# Patient Record
Sex: Female | Born: 1955 | Race: White | Hispanic: No | Marital: Married | Smoking: Never smoker
Health system: Southern US, Community
[De-identification: ages and names within clinical notes are randomized; demographics above are authoritative.]

## PROBLEM LIST (undated history)

## (undated) DIAGNOSIS — K121 Other forms of stomatitis: Secondary | ICD-10-CM

## (undated) DIAGNOSIS — I10 Essential (primary) hypertension: Secondary | ICD-10-CM

## (undated) DIAGNOSIS — E119 Type 2 diabetes mellitus without complications: Secondary | ICD-10-CM

## (undated) DIAGNOSIS — I739 Peripheral vascular disease, unspecified: Secondary | ICD-10-CM

## (undated) DIAGNOSIS — E7800 Pure hypercholesterolemia, unspecified: Secondary | ICD-10-CM

## (undated) HISTORY — PX: CHOLECYSTECTOMY: SHX55

## (undated) HISTORY — PX: TONSILLECTOMY: SUR1361

## (undated) HISTORY — PX: HERNIA REPAIR: SHX51

---

## 2015-09-26 NOTE — Progress Notes (Signed)
Subjective:    Patient ID: Tricia Schwartz, female    DOB: [birth year], 60 y.o.   MRN: [id number]  HPI pt states DM was dx'ed in 2000 (she had GDM in 1982 and 1994); she has moderate neuropathy of the lower extremities, and assoc pain; she is unaware of any associated chronic complications; she has been on insulin since 2007; pt says her diet and exercise are fair; she has never had GDM, pancreatitis, severe hypoglycemia or DKA.  She has frequent mild hypoglycemia in the morning or afternoon, as she often skips breakfast and/or lunch.  She says cbg is highest in am, but she does not check at hs. No past medical history on file.  No past surgical history on file.  Social History   Social History  . Marital Status: Married    Spouse Name: N/A  . Number of Children: N/A  . Years of Education: N/A   Occupational History  . Not on file.   Social History Main Topics  . Smoking status: Never Smoker   . Smokeless tobacco: Not on file  . Alcohol Use: 0.0 oz/week    0 Standard drinks or equivalent per week  . Drug Use: Not on file  . Sexual Activity: Not on file   Other Topics Concern  . Not on file   Social History Narrative  . No narrative on file    No current outpatient prescriptions on file prior to visit.   No current facility-administered medications on file prior to visit.    Allergies  Allergen Reactions  . Aspirin     Hives   . Meloxicam     Hives   . Penicillins Hives    Family History  Problem Relation Age of Onset  . Diabetes Mother   . Diabetes Brother     BP 128/62 mmHg  Pulse 72  Temp(Src) 98.4 F (36.9 C) (Oral)  Ht  (1.549 m)  Wt 127 lb (57.607 kg)  BMI 24.01 kg/m2  SpO2 95%   Review of Systems denies blurry vision, headache, chest pain, sob, n/v, urinary frequency, muscle cramps, excessive diaphoresis, depression, cold intolerance, rhinorrhea, and easy bruising. She has lost 33 lbs x 6 months.      Objective:   Physical Exam VS: see vs  page GEN: no distress HEAD: head: no deformity eyes: no periorbital swelling, no proptosis.  external nose and ears are normal mouth: no lesion seen NECK: ? right thyroid nodule CHEST WALL: no deformity LUNGS:  Clear to auscultation CV: reg rate and rhythm, no murmur ABD: abdomen is soft, nontender.  no hepatosplenomegaly.  not distended.  no hernia.   MUSCULOSKELETAL: muscle bulk and strength are grossly normal.  no obvious joint swelling.  gait is normal and steady EXTEMITIES: no deformity.  no ulcer on the feet.  feet are of normal color and temp.  no edema PULSES: dorsalis pedis intact bilat.  no carotid bruit NEURO:  cn 2-12 grossly intact.   readily moves all 4's.  sensation is intact to touch on the feet SKIN:  Diaphoretic.  No rash or suspicious lesion is visible.   NODES:  None palpable at the neck PSYCH: alert, well-oriented.  Does not appear anxious nor depressed.   I have reviewed outside records, and summarized: Pt was noted to have elevated a1c, and referred here.  Lab Results  Component Value Date   HGBA1C 9.5 09/27/2015      Assessment & Plan:  DM: she has several factors favoring  a dx of Type 1.  Based on the pattern of her cbg's, she needs some adjustment in her therapy ? thyroid nodule, new, uncertain etiology.    Patient is advised the following: Patient Instructions  Let's check an ultrasound of the thyroid. you will receive a phone call, about a day and time for an appointment.   good diet and exercise significantly improve the control of your diabetes.  please let me know if you wish to be referred to a dietician.  high blood sugar is very risky to your health.  you should see an eye doctor and dentist every year.  It is very important to get all recommended vaccinations.   controlling your blood pressure and cholesterol drastically reduces the damage diabetes does to your body.  Those who smoke should quit.  please discuss these with your doctor.  check  your blood sugar twice a day.  vary the time of day when you check, between before the 3 meals, and at bedtime.  also check if you have symptoms of your blood sugar being too high or too low.  please keep a record of the readings and bring it to your next appointment here (or you can bring the meter itself).  You can write it on any piece of paper.  please call us sooner if your blood sugar goes below 70, or if you have a lot of readings over 200.   For now, please reduce the lantus to 60 units at bedtime, and:  Take the novolog, 3 times a day (just before each meal), 04-11-29 units.  At breakfast and lunch, if you eat just a light snack, take just 5 units with it.   Please come back for a follow-up appointment in 1 week. Please see Bonita QuinLinda the same day, to consider the insulin pump.

## 2015-09-27 ENCOUNTER — Encounter: Payer: Self-pay | Admitting: Endocrinology

## 2015-09-27 ENCOUNTER — Ambulatory Visit (INDEPENDENT_AMBULATORY_CARE_PROVIDER_SITE_OTHER): Admitting: Endocrinology

## 2015-09-27 VITALS — BP 128/62 | HR 72 | Temp 98.4°F | Ht 61.0 in | Wt 127.0 lb

## 2015-09-27 DIAGNOSIS — G4730 Sleep apnea, unspecified: Secondary | ICD-10-CM | POA: Insufficient documentation

## 2015-09-27 DIAGNOSIS — E119 Type 2 diabetes mellitus without complications: Secondary | ICD-10-CM | POA: Insufficient documentation

## 2015-09-27 DIAGNOSIS — E049 Nontoxic goiter, unspecified: Secondary | ICD-10-CM

## 2015-09-27 DIAGNOSIS — E1042 Type 1 diabetes mellitus with diabetic polyneuropathy: Secondary | ICD-10-CM

## 2015-09-27 DIAGNOSIS — E785 Hyperlipidemia, unspecified: Secondary | ICD-10-CM | POA: Insufficient documentation

## 2015-09-27 DIAGNOSIS — I10 Essential (primary) hypertension: Secondary | ICD-10-CM

## 2015-09-27 LAB — POCT GLYCOSYLATED HEMOGLOBIN (HGB A1C): Hemoglobin A1C: 9.5

## 2015-09-27 LAB — GLUCOSE, POCT (MANUAL RESULT ENTRY): POC GLUCOSE: 69 mg/dL — AB (ref 70–99)

## 2015-09-27 NOTE — Patient Instructions (Addendum)
Let's check an ultrasound of the thyroid. you will receive a phone call, about a day and time for an appointment.   good diet and exercise significantly improve the control of your diabetes.  please let me know if you wish to be referred to a dietician.  high blood sugar is very risky to your health.  you should see an eye doctor and dentist every year.  It is very important to get all recommended vaccinations.   controlling your blood pressure and cholesterol drastically reduces the damage diabetes does to your body.  Those who smoke should quit.  please discuss these with your doctor.  check your blood sugar twice a day.  vary the time of day when you check, between before the 3 meals, and at bedtime.  also check if you have symptoms of your blood sugar being too high or too low.  please keep a record of the readings and bring it to your next appointment here (or you can bring the meter itself).  You can write it on any piece of paper.  please call us sooner if your blood sugar goes below 70, or if you have a lot of readings over 200.   For now, please reduce the lantus to 60 units at bedtime, and:  Take the novolog, 3 times a day (just before each meal), 04-11-29 units.  At breakfast and lunch, if you eat just a light snack, take just 5 units with it.   Please come back for a follow-up appointment in 1 week. Please see Bonita QuinLinda the same day, to consider the insulin pump.

## 2015-09-30 ENCOUNTER — Ambulatory Visit
Admission: RE | Admit: 2015-09-30 | Discharge: 2015-09-30 | Disposition: A | Discharge status: Home / Self Care | Source: Ambulatory Visit | Attending: Endocrinology | Admitting: Endocrinology

## 2015-09-30 ENCOUNTER — Other Ambulatory Visit

## 2015-09-30 DIAGNOSIS — E049 Nontoxic goiter, unspecified: Secondary | ICD-10-CM

## 2015-10-03 NOTE — Progress Notes (Signed)
Subjective:    Patient ID: Tricia Schwartz, female    DOB: 1955-11-13, 60 y.o.   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   Dx'ed: 2000 Complications: polyneuropathy. Therapy: insulin since 2007.  GDM: 1982 and 1994 DKA: never Severe hypoglycemia: never.  Pancreatitis: never Other: she took pump rx from 2010-2015; she takes multiple daily injections.  Interval history: no cbg record, but states cbg's vary from 160-200's.  It is lowest in the afternoon, and highest at hs.  Goiter: he does not notice.  HTN: she takes meds as rx'ed: she denies dizziness.   No past medical history on file.  No past surgical history on file.  Social History   Social History  . Marital Status: Married    Spouse Name: N/A  . Number of Children: N/A  . Years of Education: N/A   Occupational History  . Not on file.   Social History Main Topics  . Smoking status: Never Smoker   . Smokeless tobacco: Not on file  . Alcohol Use: 0.0 oz/week    0 Standard drinks or equivalent per week  . Drug Use: Not on file  . Sexual Activity: Not on file   Other Topics Concern  . Not on file   Social History Narrative  . No narrative on file    Current Outpatient Prescriptions on File Prior to Visit  Medication Sig Dispense Refill  . amLODipine (NORVASC) 5 MG tablet     . atorvastatin (LIPITOR) 80 MG tablet     . clopidogrel (PLAVIX) 75 MG tablet Take 75 mg by mouth.    Marland Kitchen FREESTYLE LITE test strip     . gabapentin (NEURONTIN) 300 MG capsule     . hydrochlorothiazide (HYDRODIURIL) 25 MG tablet     . Lancets (FREESTYLE) lancets     . LANTUS SOLOSTAR 100 UNIT/ML Solostar Pen Inject 50 Units into the skin at bedtime.     Marland Kitchen lisinopril (PRINIVIL,ZESTRIL) 40 MG tablet     . NOVOLOG FLEXPEN 100 UNIT/ML  FlexPen 3 times a day (just before each meal) 04-12-39 units    . REQUIP 2 MG tablet     . TOPROL XL 200 MG 24 hr tablet     . traZODone (DESYREL) 50 MG tablet      No current facility-administered medications on file prior to visit.    Allergies  Allergen Reactions  . Aspirin     Hives   . Meloxicam     Hives   . Penicillins Hives    Family History  Problem Relation Age of Onset  . Diabetes Mother   . Diabetes Brother     BP 104/60 mmHg  Pulse 74  Temp(Src) 98.2 F (36.8 C) (Oral)  Ht  (1.549 m)  Wt 128 lb (58.06 kg)  BMI 24.20 kg/m2  SpO2 97%   Review of Systems She denies hypoglycemia and LOC.    Objective:   Physical Exam VITAL SIGNS:  See vs page GENERAL: no distress SKIN:  Insulin injection sites at the anterior abdomen are normal.   Lab Results  Component Value Date   HGBA1C 9.5 09/27/2015   (TSH was normal by Dr Wynelle Link)    Assessment & Plan:  DM: control is improved, but she has agreed to resume pump  rx today.   HTN: slightly overcontrolled, but she is tolerating well.   Simple goiter: euthyroid.    Patient is advised the following: Patient Instructions  check your blood sugar twice a day.  vary the time of day when you check, between before the 3 meals, and at bedtime.  also check if you have symptoms of your blood sugar being too high or too low.  please keep a record of the readings and bring it to your next appointment here (or you can bring the meter itself).  You can write it on any piece of paper.  please call us sooner if your blood sugar goes below 70, or if you have a lot of readings over 200.   For now, please reduce the lantus to 50 units at bedtime, and:  increase the novolog, 3 times a day (just before each meal), 04-12-39 units.  At breakfast and lunch, if you eat just a light snack, take just 5 units with it.   Please come back for a follow-up appointment in 3 months. Please continue the same medications for blood pressure   All  you need for the thyroid is an annual blood test, and examination of the neck area.  Please see Tricia Schwartz the today, to consider the insulin pump.  When you start the pump, please start with these settings:  continue basal rate of 1.2 units/hr.   continue mealtime bolus of 1 unit/12 grams carbohydrate.  continue correction bolus (which some people call "sensitivity," or "insulin sensitivity ratio," or just "isr") of 1 unit for each 50 by which your glucose exceeds 100

## 2015-10-05 ENCOUNTER — Ambulatory Visit (INDEPENDENT_AMBULATORY_CARE_PROVIDER_SITE_OTHER): Admitting: Endocrinology

## 2015-10-05 ENCOUNTER — Ambulatory Visit: Admitting: Nutrition

## 2015-10-05 ENCOUNTER — Encounter: Attending: Endocrinology | Admitting: Nutrition

## 2015-10-05 VITALS — BP 104/60 | HR 74 | Temp 98.2°F | Ht 61.0 in | Wt 128.0 lb

## 2015-10-05 DIAGNOSIS — E1042 Type 1 diabetes mellitus with diabetic polyneuropathy: Secondary | ICD-10-CM

## 2015-10-05 NOTE — Patient Instructions (Addendum)
check your blood sugar twice a day.  vary the time of day when you check, between before the 3 meals, and at bedtime.  also check if you have symptoms of your blood sugar being too high or too low.  please keep a record of the readings and bring it to your next appointment here (or you can bring the meter itself).  You can write it on any piece of paper.  please call us sooner if your blood sugar goes below 70, or if you have a lot of readings over 200.   For now, please reduce the lantus to 50 units at bedtime, and:  increase the novolog, 3 times a day (just before each meal), 04-12-39 units.  At breakfast and lunch, if you eat just a light snack, take just 5 units with it.   Please come back for a follow-up appointment in 3 months. Please continue the same medications for blood pressure   All you need for the thyroid is an annual blood test, and examination of the neck area.  Please see Bonita QuinLinda the today, to consider the insulin pump.  When you start the pump, please start with these settings:  continue basal rate of 1.2 units/hr.   continue mealtime bolus of 1 unit/12 grams carbohydrate.  continue correction bolus (which some people call "sensitivity," or "insulin sensitivity ratio," or just "isr") of 1 unit for each 50 by which your glucose exceeds 100

## 2015-10-05 NOTE — Progress Notes (Signed)
Patient was started on her Paradigm 723 insulin pump.  New settings were put in: Basal rate: 1.2u/hr.  I/C ratio12, ISF: 50, target 100, timing: 4 hours. We reviewed how to give a bolus.  She was not using the bolus wizard and we reviewed the reasons why it is best to do this.  She was given a sheet with the servings of 15 grams of carb, and we reviewed this. We also discussed when/how to do a temp. Basal rate, and set her pump to deliver % amounts for this. She reports having taken 40u at supper, and only 10u acB and acL.  She thinks she needs more insulin for her supper meal.  She eats very little all day, and a large meal at supper.  She was told to do a bolus acS and test HS and call me the results in the AM.  She agreed to do this.

## 2015-10-05 NOTE — Patient Instructions (Signed)
Test blood sugars before supper, count carbs and test at bedtime.  Call me there results in the AM.  Review information given on carb counting, and call if questions.

## 2015-10-07 ENCOUNTER — Encounter: Payer: Self-pay | Admitting: Endocrinology

## 2016-01-07 ENCOUNTER — Ambulatory Visit: Admitting: Endocrinology

## 2017-02-04 IMAGING — US US SOFT TISSUE HEAD/NECK
1 series · 14 of 25 positions shown · non-contrast
Comparison: None.

CLINICAL DATA: 59-year-old female with thyroid goiter on physical
exam

EXAM:
THYROID ULTRASOUND
TECHNIQUE: Ultrasound examination of the thyroid gland and adjacent soft
tissues was performed.

[Series 1: us soft tissue head/neck · 0.09mm/px · 14 of 35 slices shown]
[im 1/35]
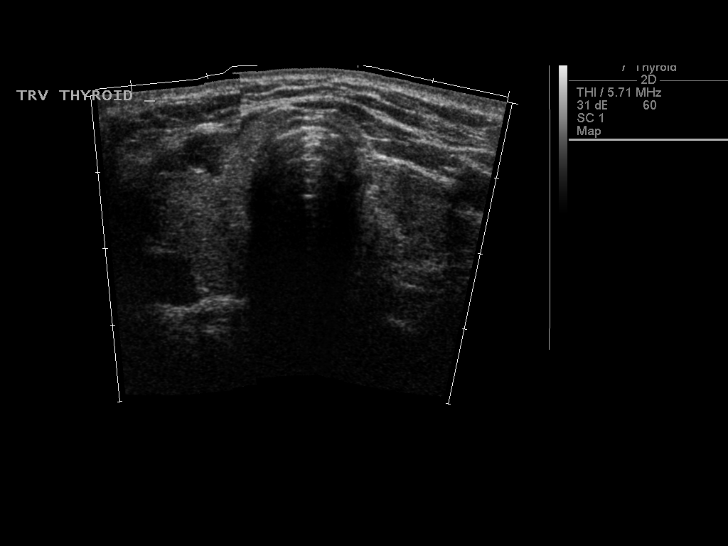
[im 3/35]
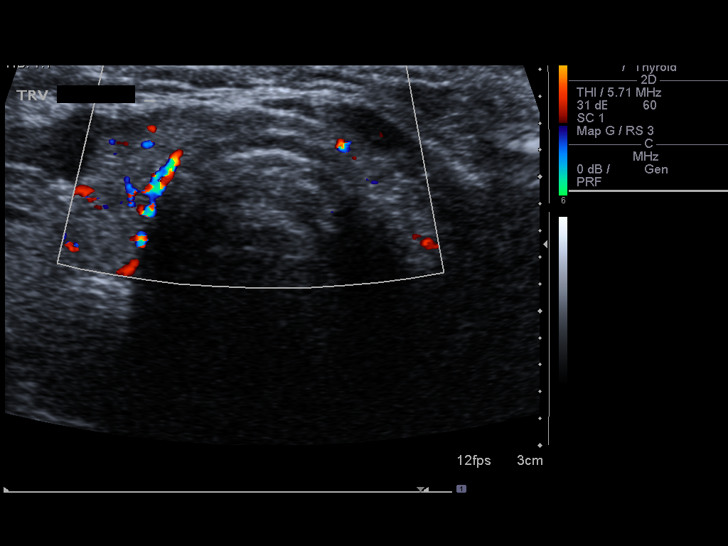
[im 6/35]
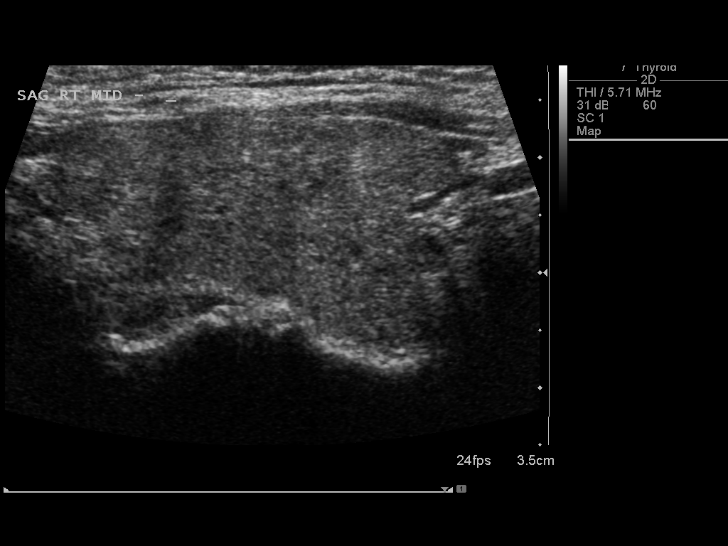
[im 9/35]
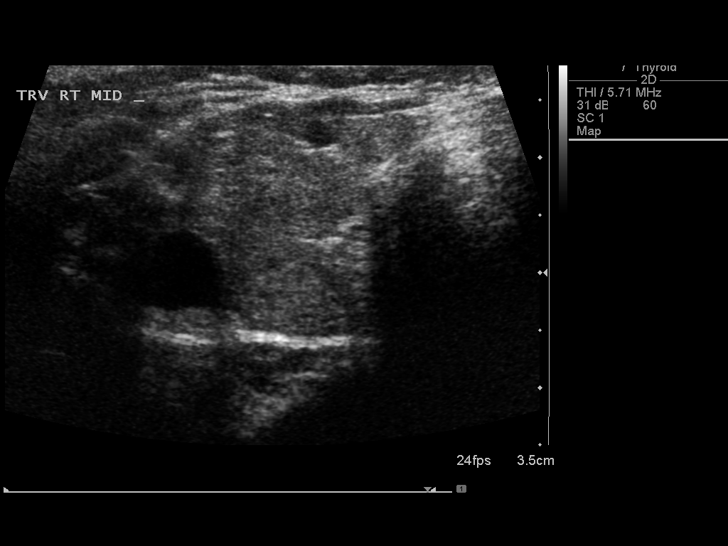
[im 12/35]
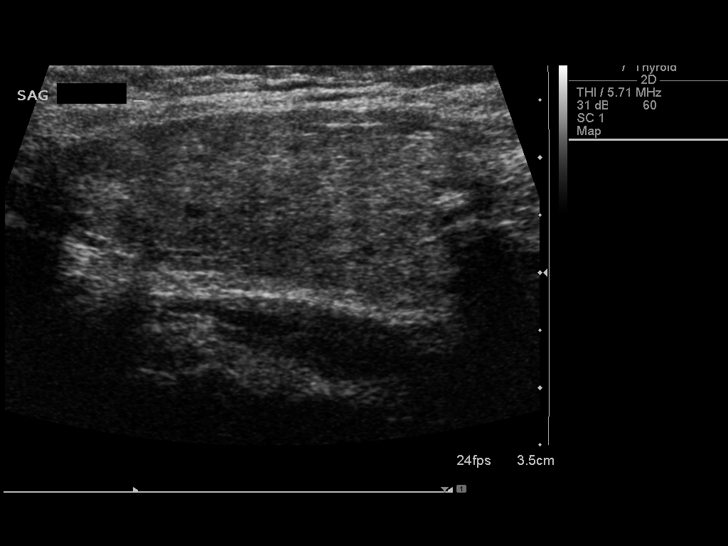
[im 13/35]
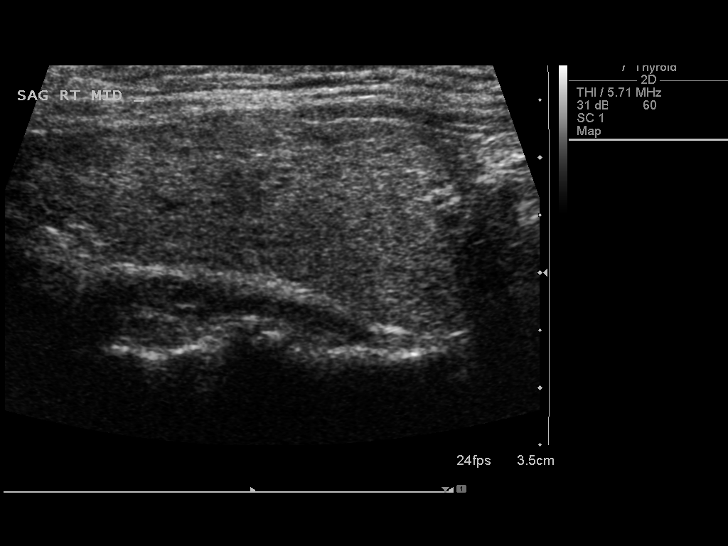
[im 16/35]
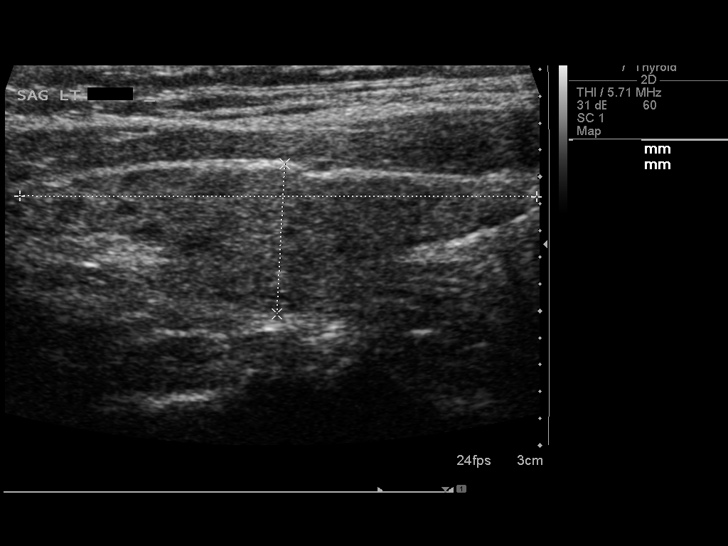
[im 19/35]
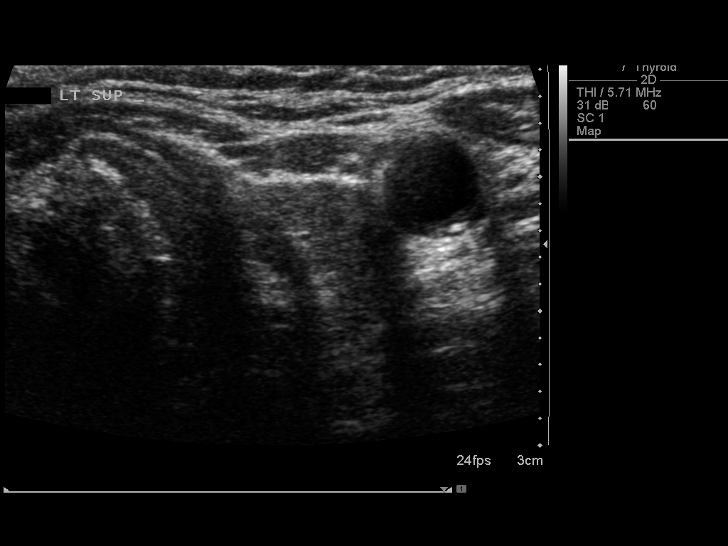
[im 22/35]
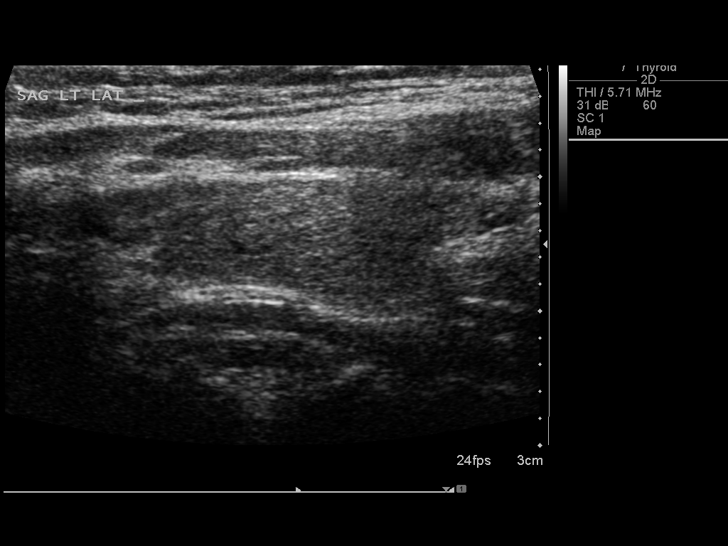
[im 23/35]
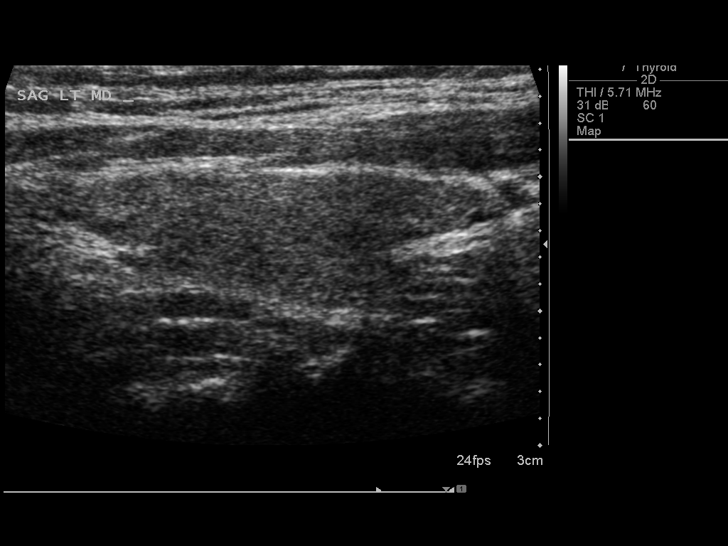
[im 26/35]
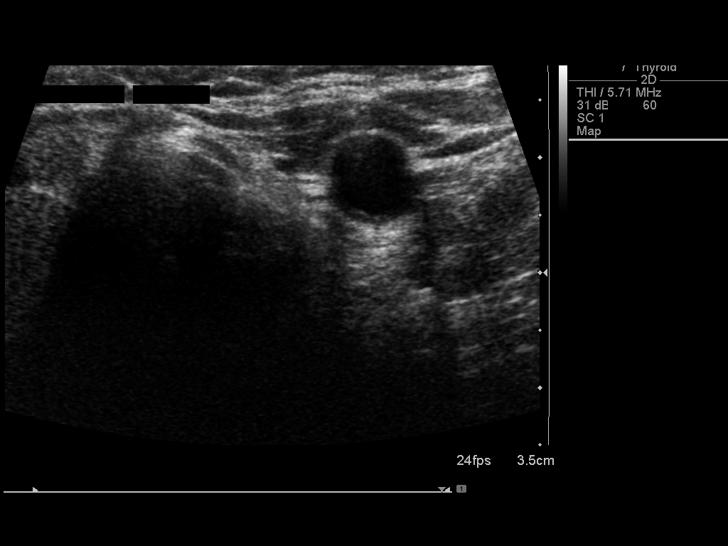
[im 29/35]
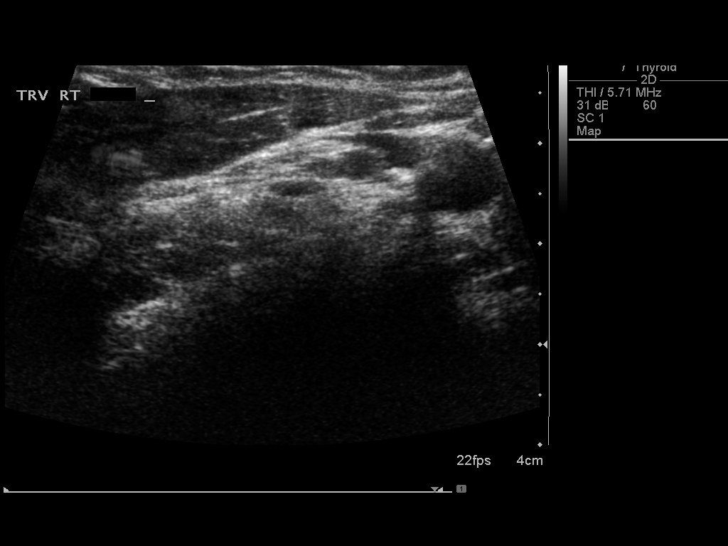
[im 32/35]
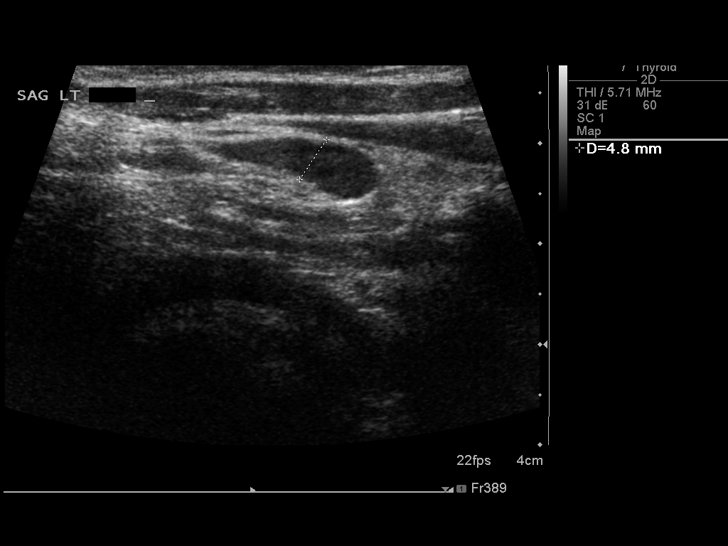
[im 35/35]
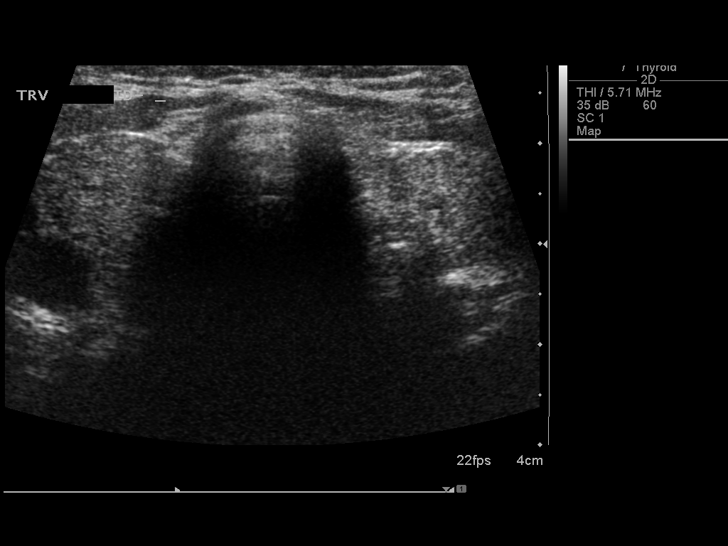

[14 of 25 positions shown; findings below may reference images not displayed]

FINDINGS: Right thyroid lobe

Measurements: 3.5 x 2.1 x 1.9 cm. No nodules visualized. Mildly
heterogeneous parenchyma.

Left thyroid lobe

Measurements: 3.9 x 1.1 x 1.4 cm. No nodules visualized. Mildly
heterogeneous parenchyma.

Isthmus

Thickness: 0.2 cm.  No nodules visualized.

Lymphadenopathy

None visualized.
IMPRESSION: Mildly heterogeneous but not enlarged thyroid gland.

No discrete nodules.

## 2018-01-18 ENCOUNTER — Emergency Department (HOSPITAL_COMMUNITY)

## 2018-01-18 ENCOUNTER — Emergency Department (HOSPITAL_COMMUNITY)
Admission: EM | Admit: 2018-01-18 | Discharge: 2018-01-18 | Disposition: A | Discharge status: Home / Self Care | Attending: Emergency Medicine | Admitting: Emergency Medicine

## 2018-01-18 ENCOUNTER — Encounter (HOSPITAL_COMMUNITY): Payer: Self-pay

## 2018-01-18 ENCOUNTER — Other Ambulatory Visit: Payer: Self-pay

## 2018-01-18 DIAGNOSIS — R51 Headache: Secondary | ICD-10-CM | POA: Insufficient documentation

## 2018-01-18 DIAGNOSIS — Z79899 Other long term (current) drug therapy: Secondary | ICD-10-CM | POA: Insufficient documentation

## 2018-01-18 DIAGNOSIS — I739 Peripheral vascular disease, unspecified: Secondary | ICD-10-CM | POA: Diagnosis not present

## 2018-01-18 DIAGNOSIS — R278 Other lack of coordination: Secondary | ICD-10-CM

## 2018-01-18 DIAGNOSIS — E119 Type 2 diabetes mellitus without complications: Secondary | ICD-10-CM | POA: Insufficient documentation

## 2018-01-18 DIAGNOSIS — I10 Essential (primary) hypertension: Secondary | ICD-10-CM | POA: Insufficient documentation

## 2018-01-18 DIAGNOSIS — R42 Dizziness and giddiness: Secondary | ICD-10-CM

## 2018-01-18 DIAGNOSIS — Z794 Long term (current) use of insulin: Secondary | ICD-10-CM | POA: Insufficient documentation

## 2018-01-18 DIAGNOSIS — R519 Headache, unspecified: Secondary | ICD-10-CM

## 2018-01-18 DIAGNOSIS — Z7901 Long term (current) use of anticoagulants: Secondary | ICD-10-CM | POA: Diagnosis not present

## 2018-01-18 HISTORY — DX: Pure hypercholesterolemia, unspecified: E78.00

## 2018-01-18 HISTORY — DX: Peripheral vascular disease, unspecified: I73.9

## 2018-01-18 HISTORY — DX: Other forms of stomatitis: K12.1

## 2018-01-18 HISTORY — DX: Essential (primary) hypertension: I10

## 2018-01-18 HISTORY — DX: Type 2 diabetes mellitus without complications: E11.9

## 2018-01-18 LAB — CBC
HCT: 43.4 % (ref 36.0–46.0)
Hemoglobin: 14.2 g/dL (ref 12.0–15.0)
MCH: 28.5 pg (ref 26.0–34.0)
MCHC: 32.7 g/dL (ref 30.0–36.0)
MCV: 87 fL (ref 78.0–100.0)
Platelets: 394 10*3/uL (ref 150–400)
RBC: 4.99 MIL/uL (ref 3.87–5.11)
RDW: 13.9 % (ref 11.5–15.5)
WBC: 11.8 10*3/uL — AB (ref 4.0–10.5)

## 2018-01-18 LAB — BASIC METABOLIC PANEL
Anion gap: 9 (ref 5–15)
BUN: 20 mg/dL (ref 8–23)
CO2: 27 mmol/L (ref 22–32)
Calcium: 8.9 mg/dL (ref 8.9–10.3)
Chloride: 107 mmol/L (ref 98–111)
Creatinine, Ser: 0.64 mg/dL (ref 0.44–1.00)
GFR calc non Af Amer: >60 mL/min (ref >60)
Glucose, Bld: 98 mg/dL (ref 70–99)
Potassium: 3.8 mmol/L (ref 3.5–5.1)
SODIUM: 143 mmol/L (ref 135–145)

## 2018-01-18 LAB — CBG MONITORING, ED
GLUCOSE-CAPILLARY: 97 mg/dL (ref 70–99)
GLUCOSE-CAPILLARY: 49 mg/dL — AB (ref 70–99)
GLUCOSE-CAPILLARY: 137 mg/dL — AB (ref 70–99)

## 2018-01-18 LAB — I-STAT CHEM 8, ED
BUN: 20 mg/dL (ref 8–23)
Calcium, Ion: 1.19 mmol/L (ref 1.15–1.40)
Chloride: 105 mmol/L (ref 98–111)
Creatinine, Ser: 0.7 mg/dL (ref 0.44–1.00)
Glucose, Bld: 65 mg/dL — ABNORMAL LOW (ref 70–99)
HEMATOCRIT: 41 % (ref 36.0–46.0)
HEMOGLOBIN: 13.9 g/dL (ref 12.0–15.0)
POTASSIUM: 3.7 mmol/L (ref 3.5–5.1)
SODIUM: 142 mmol/L (ref 135–145)
TCO2: 27 mmol/L (ref 22–32)

## 2018-01-18 LAB — DIFFERENTIAL
Basophils Absolute: 0 10*3/uL (ref 0.0–0.1)
Basophils Relative: 0 %
Eosinophils Absolute: 0.8 10*3/uL — ABNORMAL HIGH (ref 0.0–0.7)
Eosinophils Relative: 6 %
LYMPHS ABS: 2.5 10*3/uL (ref 0.7–4.0)
LYMPHS PCT: 21 %
MONO ABS: 0.7 10*3/uL (ref 0.1–1.0)
Monocytes Relative: 6 %
NEUTROS ABS: 7.9 10*3/uL — AB (ref 1.7–7.7)
NEUTROS PCT: 67 %

## 2018-01-18 LAB — I-STAT TROPONIN, ED: TROPONIN I, POC: 0 ng/mL (ref 0.00–0.08)

## 2018-01-18 LAB — PROTIME-INR
INR: 0.86
Prothrombin Time: 11.6 seconds (ref 11.4–15.2)

## 2018-01-18 LAB — ETHANOL

## 2018-01-18 MED ORDER — MORPHINE SULFATE (PF) 4 MG/ML IV SOLN
4.0000 mg | Freq: Once | INTRAVENOUS | Status: AC
  Administered 2018-01-18: 4 mg via INTRAVENOUS
  Filled 2018-01-18: qty 1

## 2018-01-18 MED ORDER — PROCHLORPERAZINE EDISYLATE 10 MG/2ML IJ SOLN
10.0000 mg | Freq: Once | INTRAMUSCULAR | Status: AC
  Administered 2018-01-18: 10 mg via INTRAVENOUS
  Filled 2018-01-18: qty 2

## 2018-01-18 NOTE — Discharge Instructions (Addendum)
Please call your primary care physician for follow-up  Please return the emergency department for any new or worsening symptoms.  Continue to check your blood sugars often.    Please make good dietary choices

## 2018-01-18 NOTE — ED Triage Notes (Signed)
Patient reports having a headache last night that went away, but came back this AM. Patient states that she had a near syncopal episode approx 20 minutes ago along with dizziness.

## 2018-01-18 NOTE — ED Provider Notes (Signed)
[facility]-EMERGENCY DEPT Provider Note   CSN: [id number] Arrival date & time: 01/18/18  0934     History   Chief Complaint Chief Complaint  Patient presents with  . Near Syncope  . Headache    HPI Tricia Schwartz is a 62 y.o. female.  HPI Patient is a 62 year old diabetic who presents the emergency department with lightheadedness and severe headache today.  She originally began having a slight headache yesterday with and her headache worsened today.  She is been ataxic since yesterday.  She denies unilateral arm or leg weakness.  No change in her speech.  No facial droop.  She does report some difficulty with her new insulin pump and states that her blood sugars have been running in the 200s.  She is been eating and drinking normally.  Denies melena or hematochezia.  She is never had a headache as severe as this before.  No neck pain or neck stiffness.  No chest pain or shortness of breath.  No palpitations.  Denies abdominal pain.  Reports some nausea earlier without vomiting.  About 20 minutes prior to arrival she stood up and became lightheaded and felt somewhat weak.  No urinary symptoms   Past Medical History:  Diagnosis Date  . Diabetes mellitus without complication (HCC)   . High cholesterol   . Hypertension   . PVD (peripheral vascular disease) (HCC)   . Ulcer (traumatic) of oral mucosa     Patient Active Problem List   Diagnosis Date Noted  . Goiter 09/27/2015  . Diabetes (HCC) 09/27/2015  . Dyslipidemia 09/27/2015  . Sleep apnea 09/27/2015  . HTN (hypertension) 09/27/2015    Past Surgical History:  Procedure Laterality Date  . CHOLECYSTECTOMY    . HERNIA REPAIR    . TONSILLECTOMY       OB History   None      Home Medications    Prior to Admission medications   Medication Sig Start Date End Date Taking? Authorizing Provider  amLODipine (NORVASC) 5 MG tablet Take 5 mg by mouth daily.  08/02/15  Yes [provider]    atorvastatin (LIPITOR) 80 MG tablet Take 80 mg by mouth daily at 6 (six) AM.  08/07/15  Yes [provider]  clopidogrel (PLAVIX) 75 MG tablet Take 75 mg by mouth daily.    Yes [provider]  ferrous sulfate 325 (65 FE) MG tablet Take 325 mg by mouth daily. 08/26/17  Yes [provider]  gabapentin (NEURONTIN) 300 MG capsule Take 600 mg by mouth daily as needed (pain).  08/02/15  Yes [provider]  hydrochlorothiazide (HYDRODIURIL) 25 MG tablet Take 25 mg by mouth daily.  08/07/15  Yes [provider]  insulin aspart (NOVOLOG) 100 UNIT/ML injection Inject 200 Units into the skin every 3 (three) days. Insulin pump   Yes [provider]  lisinopril (PRINIVIL,ZESTRIL) 40 MG tablet Take 40 mg by mouth daily.  08/07/15  Yes [provider]  TOPROL XL 200 MG 24 hr tablet Take 200 mg by mouth daily.  09/07/15  Yes [provider]  Vitamin D, Ergocalciferol, (DRISDOL) 50000 units CAPS capsule Take 1 capsule by mouth once a week. 06/30/17  Yes [provider]  FREESTYLE LITE test strip  07/01/15   [provider]  Lancets (FREESTYLE) lancets  09/11/15   [provider]    Family History Family History  Problem Relation Age of Onset  . Diabetes Mother   . Diabetes Brother  Social History Social History   Tobacco Use  . Smoking status: Never Smoker  . Smokeless tobacco: Never Used  Substance Use Topics  . Alcohol use: Yes    Alcohol/week: 0.0 oz  . Drug use: Never     Allergies   Aspirin; Meloxicam; and Penicillins   Review of Systems Review of Systems  All other systems reviewed and are negative.    Physical Exam Updated Vital Signs BP 102/60 (BP Location: Left Arm)   Pulse 66   Temp 98 F (36.7 C)   Resp 17   Ht 5\' 1"  (1.549 m)   Wt 59 kg (130 lb)   SpO2 97%   BMI 24.56 kg/m   Physical Exam  Constitutional: She is oriented to person, place, and time. She appears  well-developed and well-nourished.  HENT:  Head: Normocephalic and atraumatic.  Eyes: Pupils are equal, round, and reactive to light.  Cardiovascular: Normal rate and regular rhythm.  Pulmonary/Chest: Effort normal and breath sounds normal.  Abdominal: Soft. There is no tenderness.  Musculoskeletal: Normal range of motion.  Neurological: She is alert and oriented to person, place, and time.  5/5 strength in major muscle groups of  bilateral upper and lower extremities. Speech normal. No facial asymetry.  Finger-to-nose normal bilaterally.  Heel-to-shin bilaterally is normal.  Skin: Skin is warm and dry.  Psychiatric: She has a normal mood and affect.  Nursing note and vitals reviewed.    ED Treatments / Results  Labs (all labs ordered are listed, but only abnormal results are displayed) Labs Reviewed  CBC - Abnormal; Notable for the following components:      Result Value   WBC 11.8 (*)    All other components within normal limits  DIFFERENTIAL - Abnormal; Notable for the following components:   Neutro Abs 7.9 (*)    Eosinophils Absolute 0.8 (*)    All other components within normal limits  I-STAT CHEM 8, ED - Abnormal; Notable for the following components:   Glucose, Bld 65 (*)    All other components within normal limits  BASIC METABOLIC PANEL  ETHANOL  PROTIME-INR  URINALYSIS, ROUTINE W REFLEX MICROSCOPIC  CBG MONITORING, ED  I-STAT TROPONIN, ED  CBG MONITORING, ED    EKG EKG Interpretation  Date/Time:  Friday January 18 2018 09:45:29 EDT Ventricular Rate:  76 PR Interval:    QRS Duration: 100 QT Interval:  391 QTC Calculation: 440 R Axis:   89 Text Interpretation:  Sinus rhythm Borderline right axis deviation Baseline wander in lead(s) II aVR No old tracing to compare Confirmed by Azalia Bilis ([id number]) on 01/18/2018 1:57:50 PM   Radiology Ct Head Wo Contrast  Result Date: 01/18/2018 CLINICAL DATA:  Ataxia EXAM: CT HEAD WITHOUT CONTRAST TECHNIQUE: Contiguous  axial images were obtained from the base of the skull through the vertex without intravenous contrast. COMPARISON:  01/14/2016 FINDINGS: Brain: No acute intracranial abnormality. Specifically, no hemorrhage, hydrocephalus, mass lesion, acute infarction, or significant intracranial injury. Vascular: No hyperdense vessel or unexpected calcification. Skull: No acute calvarial abnormality. Sinuses/Orbits: Visualized paranasal sinuses and mastoids clear. Orbital soft tissues unremarkable. Other: None IMPRESSION: No intracranial abnormality. Electronically Signed   By: Charlett Nose M.D.   On: 01/18/2018 11:51   Mr Brain Wo Contrast  Result Date: 01/18/2018 CLINICAL DATA:  Patient reports headache which is intermittent. Near syncopal episode with dizziness. Ataxia, stroke suspected. EXAM: MRI HEAD WITHOUT CONTRAST TECHNIQUE: Multiplanar, multiecho pulse sequences of the brain and surrounding structures were obtained without intravenous  contrast. COMPARISON:  CT head 01/18/2018. FINDINGS: Brain: No evidence for acute infarction, hemorrhage, mass lesion, hydrocephalus, or extra-axial fluid. Normal for age cerebral volume. No significant white matter disease. Vascular: Flow voids are maintained.  No foci of chronic hemorrhage. Skull and upper cervical spine: Unremarkable visualized calvarium, skullbase, and cervical vertebrae. Pituitary, pineal, cerebellar tonsils unremarkable. No upper cervical cord lesions. Sinuses/Orbits: Paranasal sinuses are clear.  No orbital findings. Other: No mastoid fluid. IMPRESSION: Negative exam. No acute stroke or other focal intracranial abnormality. Patent proximal craniocervical vasculature. Electronically Signed   By: Elsie StainJohn T Curnes M.D.   On: 01/18/2018 13:09    Procedures Procedures (including critical care time)  Medications Ordered in ED Medications  prochlorperazine (COMPAZINE) injection 10 mg (10 mg Intravenous Given 01/18/18 1047)  morphine 4 MG/ML injection 4 mg (4 mg  Intravenous Given 01/18/18 1048)     Initial Impression / Assessment and Plan / ED Course  I have reviewed the triage vital signs and the nursing notes.  Pertinent labs & imaging results that were available during my care of the patient were reviewed by me and considered in my medical decision making (see chart for details).     MRI and CT without evidence of acute abnormality.  No evidence of cerebellar stroke.  Blood sugar was noted to be 65 on the BMP.  Blood sugar will be rechecked at this time.  This could be mild hypoglycemia causing her symptoms.  No evidence of stroke.  Doubt ACS.  Doubt PE.  Doubt dissection.  Overall well-appearing.  Headache feels much better after migraine type medicine in the emergency department.  Outpatient primary care and neurology follow-up.  Patient understands return to the ER for new or worsening symptoms  Final Clinical Impressions(s) / ED Diagnoses   Final diagnoses:  Acute ataxia  Acute nonintractable headache, unspecified headache type  Lightheadedness    ED Discharge Orders    None       Azalia Bilisampos, Kentrel Clevenger, MD 01/18/18 1358

## 2018-01-18 NOTE — ED Notes (Signed)
Dr Patria Maneampos made aware of pt CBG 49 and that patient was given juice with crackers and peanut butter.

## 2018-01-18 NOTE — ED Notes (Signed)
Patient transported to MRI 

## 2018-04-02 ENCOUNTER — Encounter

## 2018-04-02 ENCOUNTER — Encounter: Admitting: Vascular Surgery

## 2020-12-31 DEATH — deceased
